# Patient Record
Sex: Female | Born: 1960 | Race: White | Hispanic: No | Marital: Single | Smoking: Current every day smoker
Health system: Southern US, Community
[De-identification: ages and names within clinical notes are randomized; demographics above are authoritative.]

## PROBLEM LIST (undated history)

## (undated) DIAGNOSIS — F32A Depression, unspecified: Secondary | ICD-10-CM

## (undated) DIAGNOSIS — E7800 Pure hypercholesterolemia, unspecified: Secondary | ICD-10-CM

## (undated) DIAGNOSIS — F419 Anxiety disorder, unspecified: Secondary | ICD-10-CM

## (undated) DIAGNOSIS — F329 Major depressive disorder, single episode, unspecified: Secondary | ICD-10-CM

## (undated) DIAGNOSIS — F319 Bipolar disorder, unspecified: Secondary | ICD-10-CM

---

## 2014-03-11 ENCOUNTER — Emergency Department (HOSPITAL_COMMUNITY)
Admission: EM | Admit: 2014-03-11 | Discharge: 2014-03-11 | Disposition: A | Discharge status: Home / Self Care | Payer: Medicare Other | Attending: Emergency Medicine | Admitting: Emergency Medicine

## 2014-03-11 ENCOUNTER — Emergency Department (HOSPITAL_COMMUNITY): Payer: Medicare Other

## 2014-03-11 ENCOUNTER — Encounter (HOSPITAL_COMMUNITY): Payer: Self-pay | Admitting: Emergency Medicine

## 2014-03-11 DIAGNOSIS — E780 Pure hypercholesterolemia: Secondary | ICD-10-CM | POA: Insufficient documentation

## 2014-03-11 DIAGNOSIS — Z720 Tobacco use: Secondary | ICD-10-CM | POA: Insufficient documentation

## 2014-03-11 DIAGNOSIS — W1839XA Other fall on same level, initial encounter: Secondary | ICD-10-CM | POA: Diagnosis not present

## 2014-03-11 DIAGNOSIS — F419 Anxiety disorder, unspecified: Secondary | ICD-10-CM | POA: Insufficient documentation

## 2014-03-11 DIAGNOSIS — Y929 Unspecified place or not applicable: Secondary | ICD-10-CM | POA: Diagnosis not present

## 2014-03-11 DIAGNOSIS — Y939 Activity, unspecified: Secondary | ICD-10-CM | POA: Insufficient documentation

## 2014-03-11 DIAGNOSIS — S0083XA Contusion of other part of head, initial encounter: Secondary | ICD-10-CM | POA: Diagnosis not present

## 2014-03-11 DIAGNOSIS — F319 Bipolar disorder, unspecified: Secondary | ICD-10-CM | POA: Diagnosis not present

## 2014-03-11 DIAGNOSIS — S060X0A Concussion without loss of consciousness, initial encounter: Secondary | ICD-10-CM | POA: Diagnosis not present

## 2014-03-11 DIAGNOSIS — Z79899 Other long term (current) drug therapy: Secondary | ICD-10-CM | POA: Insufficient documentation

## 2014-03-11 DIAGNOSIS — R410 Disorientation, unspecified: Secondary | ICD-10-CM

## 2014-03-11 DIAGNOSIS — S0990XA Unspecified injury of head, initial encounter: Secondary | ICD-10-CM

## 2014-03-11 HISTORY — DX: Depression, unspecified: F32.A

## 2014-03-11 HISTORY — DX: Major depressive disorder, single episode, unspecified: F32.9

## 2014-03-11 HISTORY — DX: Pure hypercholesterolemia, unspecified: E78.00

## 2014-03-11 HISTORY — DX: Anxiety disorder, unspecified: F41.9

## 2014-03-11 HISTORY — DX: Bipolar disorder, unspecified: F31.9

## 2014-03-11 LAB — BASIC METABOLIC PANEL
Anion gap: 18 — ABNORMAL HIGH (ref 5–15)
BUN: 18 mg/dL (ref 6–23)
CHLORIDE: 100 meq/L (ref 96–112)
CO2: 21 mEq/L (ref 19–32)
Calcium: 9.5 mg/dL (ref 8.4–10.5)
Creatinine, Ser: 0.63 mg/dL (ref 0.50–1.10)
Glucose, Bld: 93 mg/dL (ref 70–99)
POTASSIUM: 4.1 meq/L (ref 3.7–5.3)
Sodium: 139 mEq/L (ref 137–147)

## 2014-03-11 LAB — CBC
HEMATOCRIT: 36.8 % (ref 36.0–46.0)
HEMOGLOBIN: 12.5 g/dL (ref 12.0–15.0)
MCH: 29.9 pg (ref 26.0–34.0)
MCHC: 34 g/dL (ref 30.0–36.0)
MCV: 88 fL (ref 78.0–100.0)
Platelets: 220 10*3/uL (ref 150–400)
RBC: 4.18 MIL/uL (ref 3.87–5.11)
RDW: 13.2 % (ref 11.5–15.5)
WBC: 11.1 10*3/uL — AB (ref 4.0–10.5)

## 2014-03-11 LAB — ETHANOL

## 2014-03-11 NOTE — ED Provider Notes (Signed)
CSN: [id number]     Arrival date & time 03/11/14  1319 History   First MD Initiated Contact with Patient 03/11/14 1322     Chief Complaint  Patient presents with  . Fall      HPI Patient reports she fell last night and injured her left head.  She denies neck pain.  She denies weakness of her arms or legs.  Daughter reports she seems slightly confused today.  No recent illness.  No fevers or chills.  Denies chest pain.  No abdominal pain.  She's not sure as to the exact cause of her fall nor what she had with her head.  Nausea without vomiting.  Denies diarrhea.   Past Medical History  Diagnosis Date  . Hypercholesteremia   . Bipolar 1 disorder   . Depression   . Anxiety    History reviewed. No pertinent past surgical history. History reviewed. No pertinent family history. History  Substance Use Topics  . Smoking status: Current Every Day Smoker  . Smokeless tobacco: Not on file  . Alcohol Use: Yes   OB History    No data available     Review of Systems  All other systems reviewed and are negative.     Allergies  Review of patient's allergies indicates no known allergies.  Home Medications   Prior to Admission medications   Medication Sig Start Date End Date Taking? Authorizing Provider  ALPRAZolam Prudy Feeler(XANAX) 0.5 MG tablet Take 0.5 mg by mouth at bedtime as needed for anxiety.   Yes Historical Provider, MD  atorvastatin (LIPITOR) 20 MG tablet Take 20 mg by mouth daily.   Yes Historical Provider, MD  citalopram (CELEXA) 10 MG tablet Take 10 mg by mouth daily.   Yes Historical Provider, MD  GABAPENTIN PO Take 1 tablet by mouth at bedtime.   Yes Historical Provider, MD  OMEPRAZOLE PO Take 1 capsule by mouth daily.   Yes Historical Provider, MD   BP 150/76 mmHg  Pulse 87  Temp(Src) 98.4 F (36.9 C) (Oral)  Resp 15  Ht 5\' 5"  (1.651 m)  Wt 230 lb (104.327 kg)  BMI 38.27 kg/m2  SpO2 98% Physical Exam  Constitutional: She is oriented to person, place, and time. She  appears well-developed and well-nourished. No distress.  HENT:  Head: Normocephalic.  Small contusion hematoma of the left anterior forehead.  Small periorbital bruising noted on the left inferior portion of the supraorbital rim.  No significant swelling.  Extraocular movements are intact.  No trismus or malocclusion.  Dentition without acute traumatic injury  Eyes: EOM are normal.  Neck: Normal range of motion.  Cardiovascular: Normal rate, regular rhythm and normal heart sounds.   Pulmonary/Chest: Effort normal and breath sounds normal.  Abdominal: Soft. She exhibits no distension. There is no tenderness.  Musculoskeletal: Normal range of motion.  Neurological: She is alert and oriented to person, place, and time.  Skin: Skin is warm and dry.  Psychiatric: She has a normal mood and affect. Judgment normal.  Nursing note and vitals reviewed.   ED Course  Procedures (including critical care time) Labs Review Labs Reviewed  CBC - Abnormal; Notable for the following:    WBC 11.1 (*)    All other components within normal limits  BASIC METABOLIC PANEL - Abnormal; Notable for the following:    Anion gap 18 (*)    All other components within normal limits  ETHANOL    Imaging Review Ct Head Wo Contrast  03/11/2014  CLINICAL DATA:  Fall.  Initial in counter.  EXAM: CT HEAD WITHOUT CONTRAST  TECHNIQUE: Contiguous axial images were obtained from the base of the skull through the vertex without intravenous contrast.  COMPARISON:  None.  FINDINGS: No mass effect, midline shift, or acute intracranial hemorrhage. Brain parenchyma and ventricular system are unremarkable. Mastoid air cells are clear. No skull fracture. Soft tissue swelling over the left frontal bone. Extensive mucosal thickening in the ethmoid air cells. There is a calcified soft tissue density at the midline of the adenoidal tissue of the nasopharynx. 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  IMPRESSION: No acute intracranial pathology.   Inflammatory changes in the ethmoid air cells is noted.   Electronically Signed   By: Maryclare BeanArt  Hoss M.D.   On: 03/11/2014 14:37  I personally reviewed the imaging tests through PACS system I reviewed available ER/hospitalization records through the EMR    EKG Interpretation None      MDM   Final diagnoses:  Concussion, without loss of consciousness, initial encounter  Confusion    CT head negative.  Labs without significant abnormality.  Suspect concussion given her minor head trauma and very minor confusion.  She is alert and oriented 3 for me.  Discharge home in good condition.  Patient understands to return to the ER for new or worsening symptoms    Lyanne CoKevin M Henritta Mutz, MD 03/11/14 [phone]

## 2014-03-11 NOTE — ED Notes (Signed)
To ct

## 2014-03-11 NOTE — Discharge Instructions (Signed)
Concussion  A concussion, or closed-head injury, is a brain injury caused by a direct blow to the head or by a quick and sudden movement (jolt) of the head or neck. Concussions are usually not life-threatening. Even so, the effects of a concussion can be serious. If you have had a concussion before, you are more likely to experience concussion-like symptoms after a direct blow to the head.   CAUSES  · Direct blow to the head, such as from running into another player during a soccer game, being hit in a fight, or hitting your head on a hard surface.  · A jolt of the head or neck that causes the brain to move back and forth inside the skull, such as in a car crash.  SIGNS AND SYMPTOMS  The signs of a concussion can be hard to notice. Early on, they may be missed by you, family members, and health care providers. You may look fine but act or feel differently.  Symptoms are usually temporary, but they may last for days, weeks, or even longer. Some symptoms may appear right away while others may not show up for hours or days. Every head injury is different. Symptoms include:  · Mild to moderate headaches that will not go away.  · A feeling of pressure inside your head.  · Having more trouble than usual:  ¨ Learning or remembering things you have heard.  ¨ Answering questions.  ¨ Paying attention or concentrating.  ¨ Organizing daily tasks.  ¨ Making decisions and solving problems.  · Slowness in thinking, acting or reacting, speaking, or reading.  · Getting lost or being easily confused.  · Feeling tired all the time or lacking energy (fatigued).  · Feeling drowsy.  · Sleep disturbances.  ¨ Sleeping more than usual.  ¨ Sleeping less than usual.  ¨ Trouble falling asleep.  ¨ Trouble sleeping (insomnia).  · Loss of balance or feeling lightheaded or dizzy.  · Nausea or vomiting.  · Numbness or tingling.  · Increased sensitivity to:  ¨ Sounds.  ¨ Lights.  ¨ Distractions.  · Vision problems or eyes that tire  easily.  · Diminished sense of taste or smell.  · Ringing in the ears.  · Mood changes such as feeling sad or anxious.  · Becoming easily irritated or angry for little or no reason.  · Lack of motivation.  · Seeing or hearing things other people do not see or hear (hallucinations).  DIAGNOSIS  Your health care provider can usually diagnose a concussion based on a description of your injury and symptoms. He or she will ask whether you passed out (lost consciousness) and whether you are having trouble remembering events that happened right before and during your injury.  Your evaluation might include:  · A brain scan to look for signs of injury to the brain. Even if the test shows no injury, you may still have a concussion.  · Blood tests to be sure other problems are not present.  TREATMENT  · Concussions are usually treated in an emergency department, in urgent care, or at a clinic. You may need to stay in the hospital overnight for further treatment.  · Tell your health care provider if you are taking any medicines, including prescription medicines, over-the-counter medicines, and natural remedies. Some medicines, such as blood thinners (anticoagulants) and aspirin, may increase the chance of complications. Also tell your health care provider whether you have had alcohol or are taking illegal drugs. This information   may affect treatment.  · Your health care provider will send you home with important instructions to follow.  · How fast you will recover from a concussion depends on many factors. These factors include how severe your concussion is, what part of your brain was injured, your age, and how healthy you were before the concussion.  · Most people with mild injuries recover fully. Recovery can take time. In general, recovery is slower in older persons. Also, persons who have had a concussion in the past or have other medical problems may find that it takes longer to recover from their current injury.  HOME  CARE INSTRUCTIONS  General Instructions  · Carefully follow the directions your health care provider gave you.  · Only take over-the-counter or prescription medicines for pain, discomfort, or fever as directed by your health care provider.  · Take only those medicines that your health care provider has approved.  · Do not drink alcohol until your health care provider says you are well enough to do so. Alcohol and certain other drugs may slow your recovery and can put you at risk of further injury.  · If it is harder than usual to remember things, write them down.  · If you are easily distracted, try to do one thing at a time. For example, do not try to watch TV while fixing dinner.  · Talk with family members or close friends when making important decisions.  · Keep all follow-up appointments. Repeated evaluation of your symptoms is recommended for your recovery.  · Watch your symptoms and tell others to do the same. Complications sometimes occur after a concussion. Older adults with a brain injury may have a higher risk of serious complications, such as a blood clot on the brain.  · Tell your teachers, school nurse, school counselor, coach, athletic trainer, or work manager about your injury, symptoms, and restrictions. Tell them about what you can or cannot do. They should watch for:  ¨ Increased problems with attention or concentration.  ¨ Increased difficulty remembering or learning new information.  ¨ Increased time needed to complete tasks or assignments.  ¨ Increased irritability or decreased ability to cope with stress.  ¨ Increased symptoms.  · Rest. Rest helps the brain to heal. Make sure you:  ¨ Get plenty of sleep at night. Avoid staying up late at night.  ¨ Keep the same bedtime hours on weekends and weekdays.  ¨ Rest during the day. Take daytime naps or rest breaks when you feel tired.  · Limit activities that require a lot of thought or concentration. These include:  ¨ Doing homework or job-related  work.  ¨ Watching TV.  ¨ Working on the computer.  · Avoid any situation where there is potential for another head injury (football, hockey, soccer, basketball, martial arts, downhill snow sports and horseback riding). Your condition will get worse every time you experience a concussion. You should avoid these activities until you are evaluated by the appropriate follow-up health care providers.  Returning To Your Regular Activities  You will need to return to your normal activities slowly, not all at once. You must give your body and brain enough time for recovery.  · Do not return to sports or other athletic activities until your health care provider tells you it is safe to do so.  · Ask your health care provider when you can drive, ride a bicycle, or operate heavy machinery. Your ability to react may be slower after a   brain injury. Never do these activities if you are dizzy.  · Ask your health care provider about when you can return to work or school.  Preventing Another Concussion  It is very important to avoid another brain injury, especially before you have recovered. In rare cases, another injury can lead to permanent brain damage, brain swelling, or death. The risk of this is greatest during the first 7-10 days after a head injury. Avoid injuries by:  · Wearing a seat belt when riding in a car.  · Drinking alcohol only in moderation.  · Wearing a helmet when biking, skiing, skateboarding, skating, or doing similar activities.  · Avoiding activities that could lead to a second concussion, such as contact or recreational sports, until your health care provider says it is okay.  · Taking safety measures in your home.  ¨ Remove clutter and tripping hazards from floors and stairways.  ¨ Use grab bars in bathrooms and handrails by stairs.  ¨ Place non-slip mats on floors and in bathtubs.  ¨ Improve lighting in dim areas.  SEEK MEDICAL CARE IF:  · You have increased problems paying attention or  concentrating.  · You have increased difficulty remembering or learning new information.  · You need more time to complete tasks or assignments than before.  · You have increased irritability or decreased ability to cope with stress.  · You have more symptoms than before.  Seek medical care if you have any of the following symptoms for more than 2 weeks after your injury:  · Lasting (chronic) headaches.  · Dizziness or balance problems.  · Nausea.  · Vision problems.  · Increased sensitivity to noise or light.  · Depression or mood swings.  · Anxiety or irritability.  · Memory problems.  · Difficulty concentrating or paying attention.  · Sleep problems.  · Feeling tired all the time.  SEEK IMMEDIATE MEDICAL CARE IF:  · You have severe or worsening headaches. These may be a sign of a blood clot in the brain.  · You have weakness (even if only in one hand, leg, or part of the face).  · You have numbness.  · You have decreased coordination.  · You vomit repeatedly.  · You have increased sleepiness.  · One pupil is larger than the other.  · You have convulsions.  · You have slurred speech.  · You have increased confusion. This may be a sign of a blood clot in the brain.  · You have increased restlessness, agitation, or irritability.  · You are unable to recognize people or places.  · You have neck pain.  · It is difficult to wake you up.  · You have unusual behavior changes.  · You lose consciousness.  MAKE SURE YOU:  · Understand these instructions.  · Will watch your condition.  · Will get help right away if you are not doing well or get worse.  Document Released: 07/18/2003 Document Revised: 05/02/2013 Document Reviewed: 11/17/2012  [employer]® Patient Information ©2015 [employer]. This information is not intended to replace advice given to you by your health care provider. Make sure you discuss any questions you have with your health care provider.

## 2014-03-11 NOTE — ED Notes (Signed)
Pt c/o headache to left side pt states she fell but doesn't know when no other c/o

## 2015-12-06 IMAGING — CT CT HEAD W/O CM
1 series · 16 of 30 positions shown, 20 images · non-contrast
Comparison: None.

CLINICAL DATA: Fall.  Initial in counter.

EXAM:
CT HEAD WITHOUT CONTRAST
TECHNIQUE: Contiguous axial images were obtained from the base of the skull
through the vertex without intravenous contrast.

[Series 2: head 5.0 h30s · axial · 0.43mm/px · z∈[-175,-30]mm · 16 of 33 slices shown, 20 images]
[im 2/33  brain]
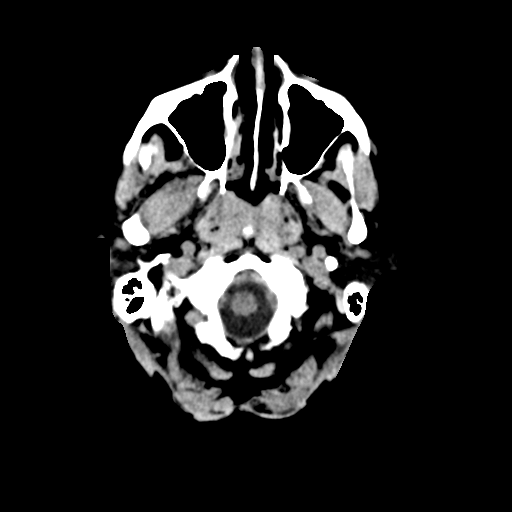
[im 2/33  bone]
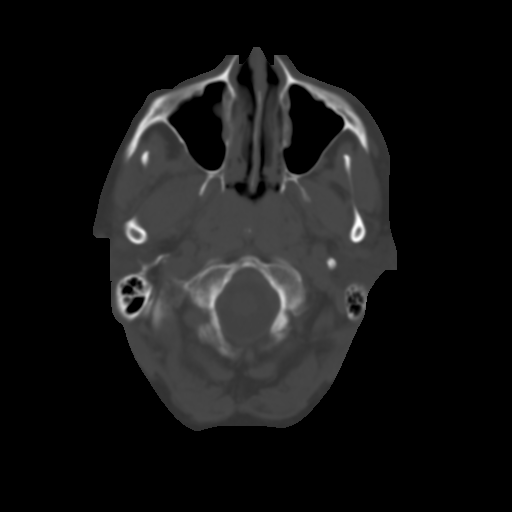
[im 4/33  brain]
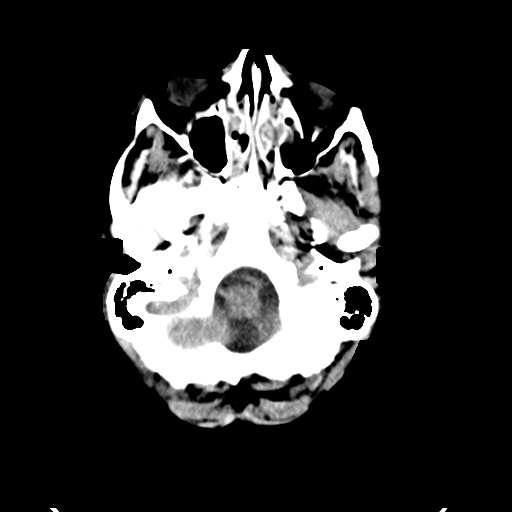
[im 6/33  brain]
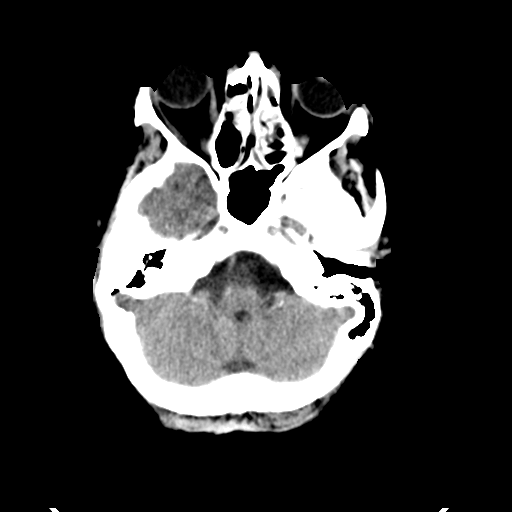
[im 8/33  brain]
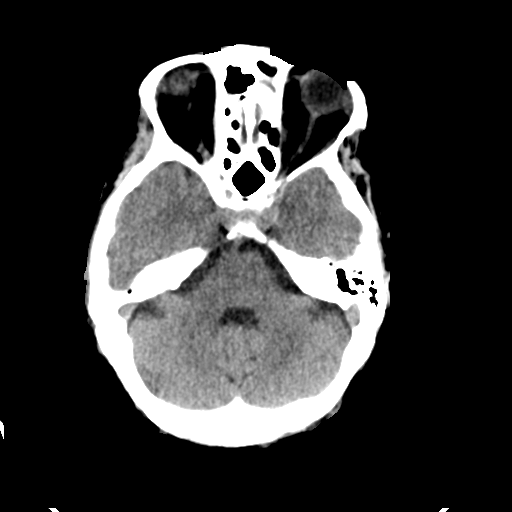
[im 9/33  brain]
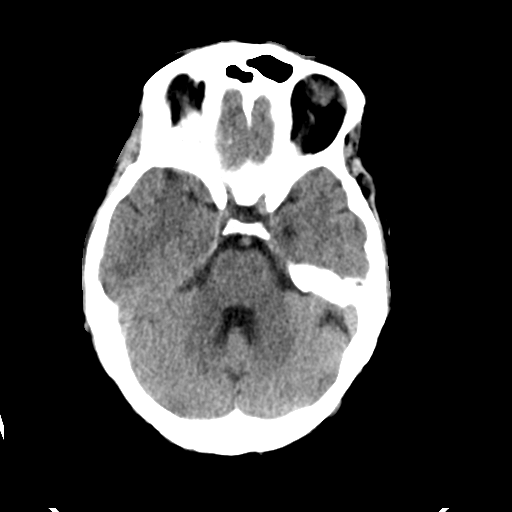
[im 9/33  bone]
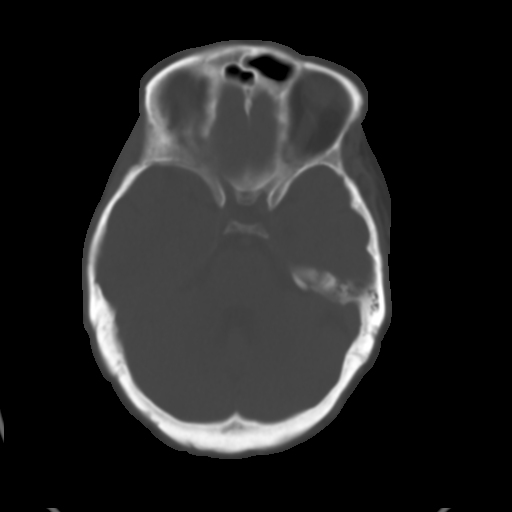
[im 12/33  brain]
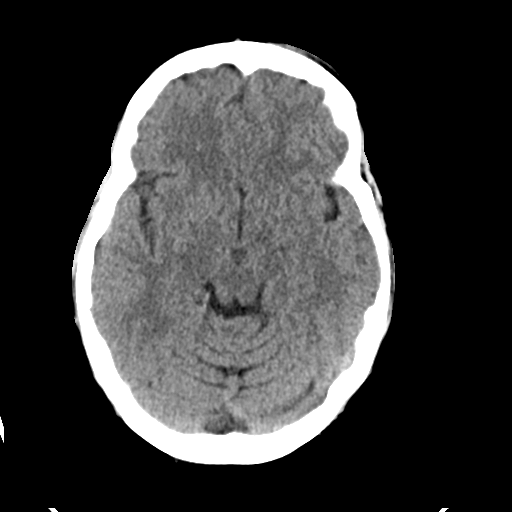
[im 14/33  brain]
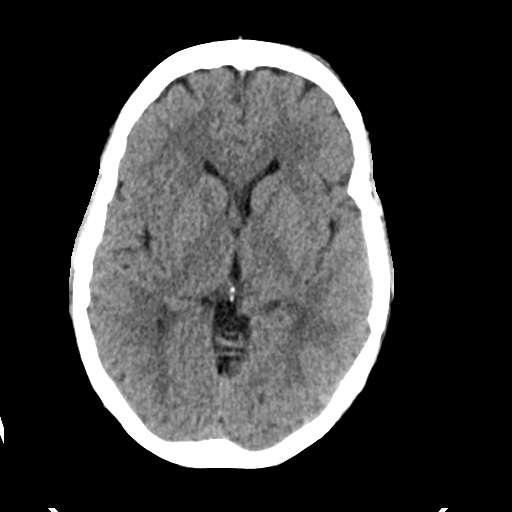
[im 16/33  brain]
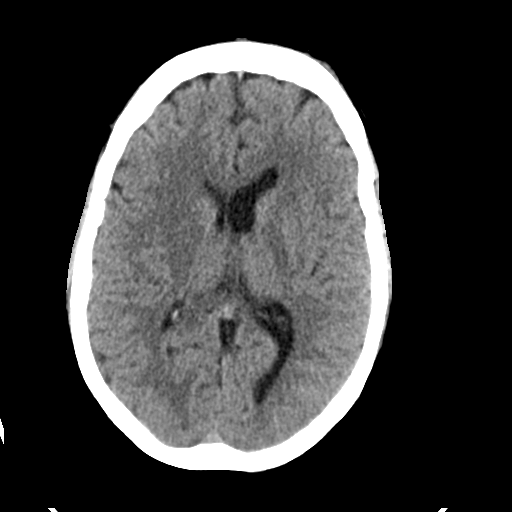
[im 17/33  brain]
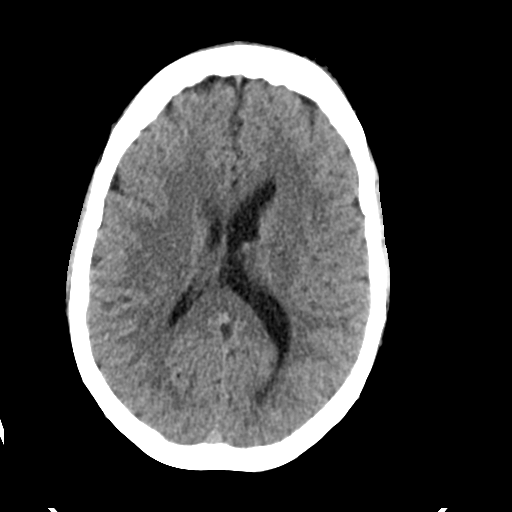
[im 17/33  bone]
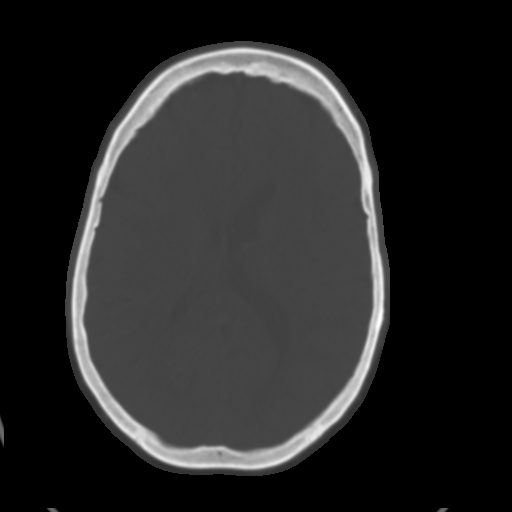
[im 19/33  brain]
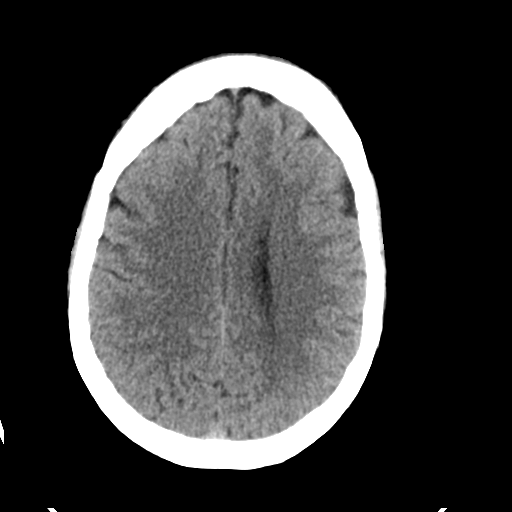
[im 21/33  brain]
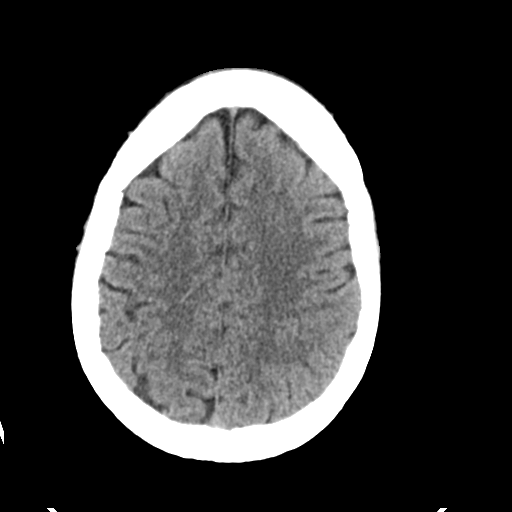
[im 24/33  brain]
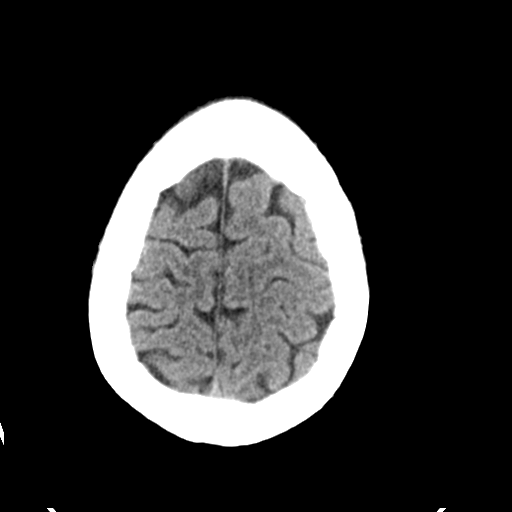
[im 25/33  brain]
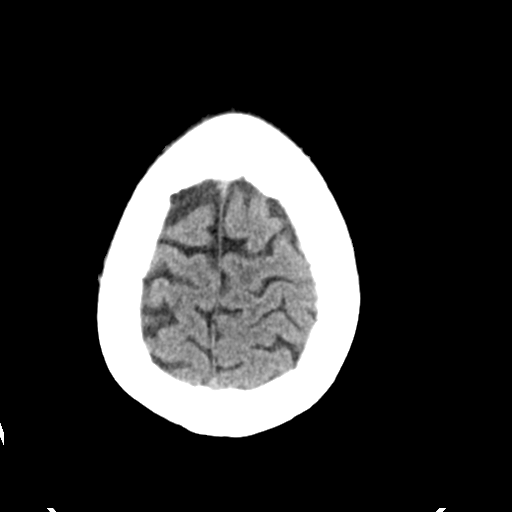
[im 25/33  bone]
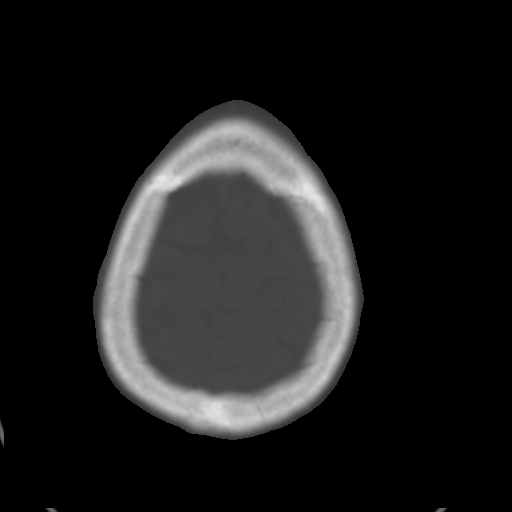
[im 27/33  brain]
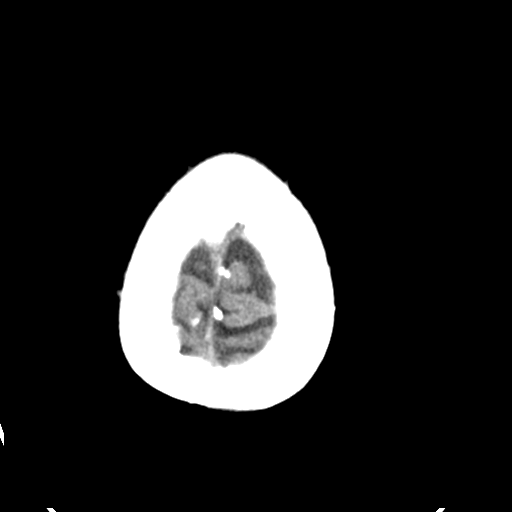
[im 29/33  brain]
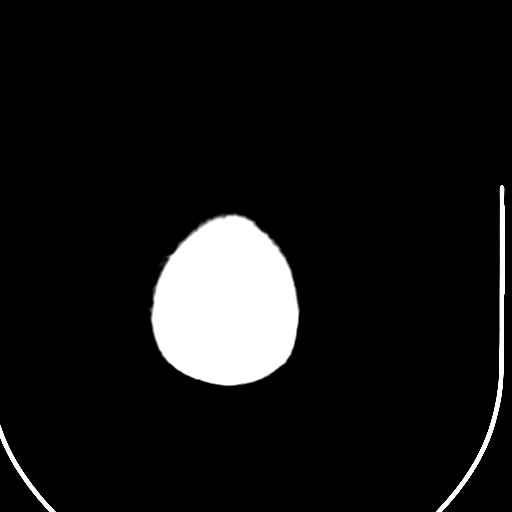
[im 31/33  brain]
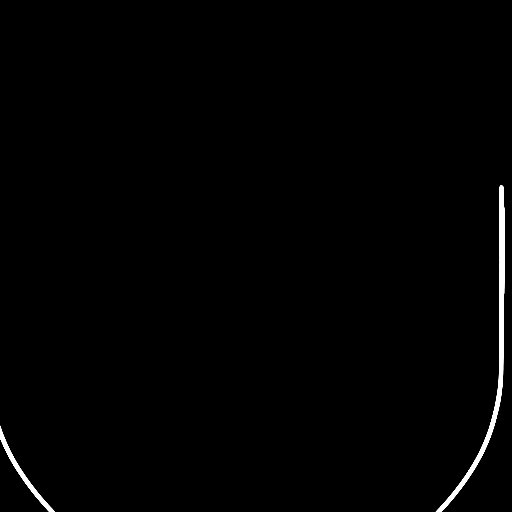

[16 of 30 positions shown; findings below may reference images not displayed]

FINDINGS: No mass effect, midline shift, or acute intracranial hemorrhage.
Brain parenchyma and ventricular system are unremarkable. Mastoid
air cells are clear. No skull fracture. Soft tissue swelling over
the left frontal bone. Extensive mucosal thickening in the ethmoid
air cells. There is a calcified soft tissue density at the midline
of the adenoidal tissue of the nasopharynx. This is likely a
Tornwaldt cyst.
IMPRESSION: No acute intracranial pathology.

Inflammatory changes in the ethmoid air cells is noted.
